# Patient Record
Sex: Male | Born: 2000 | Race: White | Hispanic: No | Marital: Single | Smoking: Never smoker
Health system: Southern US, Community
[De-identification: ages and names within clinical notes are randomized; demographics above are authoritative.]

## PROBLEM LIST (undated history)

## (undated) DIAGNOSIS — L709 Acne, unspecified: Secondary | ICD-10-CM

## (undated) HISTORY — DX: Acne, unspecified: L70.9

---
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
## 2018-03-12 ENCOUNTER — Ambulatory Visit
Admission: RE | Admit: 2018-03-12 | Discharge: 2018-03-12 | Disposition: A | Discharge status: Home / Self Care | Payer: 59 | Source: Ambulatory Visit | Attending: Family Medicine | Admitting: Family Medicine

## 2018-03-12 ENCOUNTER — Ambulatory Visit: Payer: 59 | Admitting: Family Medicine

## 2018-03-12 VITALS — BP 118/82 | Ht 74.0 in | Wt 174.0 lb

## 2018-03-12 DIAGNOSIS — G8929 Other chronic pain: Secondary | ICD-10-CM

## 2018-03-12 DIAGNOSIS — M542 Cervicalgia: Secondary | ICD-10-CM | POA: Diagnosis not present

## 2018-03-12 NOTE — Patient Instructions (Addendum)
Get x-rays of your cervical spine - we will call you with results. If these look good I would recommend physical therapy for the deep paraspinal posture muscles. Tylenol, aleve only if needed - it's unlikely these will help you get better quicker. Follow up with me 4 weeks after starting physical therapy assuming that's where we go next and your x-rays look good.

## 2018-03-13 ENCOUNTER — Encounter: Payer: Self-pay | Admitting: Family Medicine

## 2018-03-13 ENCOUNTER — Ambulatory Visit: Payer: 59 | Admitting: Sports Medicine

## 2018-03-13 NOTE — Assessment & Plan Note (Signed)
exam is reassuring.  No red flags.  No radicular symptoms.  Independently reviewed radiographs performed today and no evidence spondy, bony abnormalities, arthropathy.  Consistent with deep paraspinal muscle strain.  Start physical therapy and f/u in 4-6 weeks following PT.  Can consider MRI if not improving as expected but expect him to have good success with this.  Tylenol, aleve if needed.
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
## 2018-03-20 DIAGNOSIS — M542 Cervicalgia: Secondary | ICD-10-CM | POA: Diagnosis not present

## 2018-03-20 DIAGNOSIS — S161XXD Strain of muscle, fascia and tendon at neck level, subsequent encounter: Secondary | ICD-10-CM | POA: Diagnosis not present

## 2018-03-24 DIAGNOSIS — M542 Cervicalgia: Secondary | ICD-10-CM | POA: Diagnosis not present

## 2018-03-24 DIAGNOSIS — S161XXD Strain of muscle, fascia and tendon at neck level, subsequent encounter: Secondary | ICD-10-CM | POA: Diagnosis not present

## 2018-03-28 DIAGNOSIS — S161XXD Strain of muscle, fascia and tendon at neck level, subsequent encounter: Secondary | ICD-10-CM | POA: Diagnosis not present

## 2018-03-28 DIAGNOSIS — M542 Cervicalgia: Secondary | ICD-10-CM | POA: Diagnosis not present

## 2018-03-31 DIAGNOSIS — S161XXD Strain of muscle, fascia and tendon at neck level, subsequent encounter: Secondary | ICD-10-CM | POA: Diagnosis not present

## 2018-03-31 DIAGNOSIS — M542 Cervicalgia: Secondary | ICD-10-CM | POA: Diagnosis not present

## 2018-04-04 DIAGNOSIS — S161XXD Strain of muscle, fascia and tendon at neck level, subsequent encounter: Secondary | ICD-10-CM | POA: Diagnosis not present

## 2018-04-04 DIAGNOSIS — M542 Cervicalgia: Secondary | ICD-10-CM | POA: Diagnosis not present

## 2018-04-18 DIAGNOSIS — S161XXD Strain of muscle, fascia and tendon at neck level, subsequent encounter: Secondary | ICD-10-CM | POA: Diagnosis not present

## 2018-04-18 DIAGNOSIS — M542 Cervicalgia: Secondary | ICD-10-CM | POA: Diagnosis not present

## 2018-04-21 DIAGNOSIS — M542 Cervicalgia: Secondary | ICD-10-CM | POA: Diagnosis not present

## 2018-04-21 DIAGNOSIS — S161XXD Strain of muscle, fascia and tendon at neck level, subsequent encounter: Secondary | ICD-10-CM | POA: Diagnosis not present

## 2018-04-25 DIAGNOSIS — M542 Cervicalgia: Secondary | ICD-10-CM | POA: Diagnosis not present

## 2018-04-28 DIAGNOSIS — M542 Cervicalgia: Secondary | ICD-10-CM | POA: Diagnosis not present

## 2018-04-28 DIAGNOSIS — S161XXD Strain of muscle, fascia and tendon at neck level, subsequent encounter: Secondary | ICD-10-CM | POA: Diagnosis not present

## 2018-05-01 DIAGNOSIS — Z0000 Encounter for general adult medical examination without abnormal findings: Secondary | ICD-10-CM | POA: Diagnosis not present

## 2018-05-02 DIAGNOSIS — S161XXD Strain of muscle, fascia and tendon at neck level, subsequent encounter: Secondary | ICD-10-CM | POA: Diagnosis not present

## 2018-05-02 DIAGNOSIS — M542 Cervicalgia: Secondary | ICD-10-CM | POA: Diagnosis not present

## 2018-05-12 DIAGNOSIS — S161XXD Strain of muscle, fascia and tendon at neck level, subsequent encounter: Secondary | ICD-10-CM | POA: Diagnosis not present

## 2018-05-12 DIAGNOSIS — M542 Cervicalgia: Secondary | ICD-10-CM | POA: Diagnosis not present

## 2018-09-16 DIAGNOSIS — D225 Melanocytic nevi of trunk: Secondary | ICD-10-CM | POA: Diagnosis not present

## 2018-09-16 DIAGNOSIS — L814 Other melanin hyperpigmentation: Secondary | ICD-10-CM | POA: Diagnosis not present

## 2018-09-16 DIAGNOSIS — D485 Neoplasm of uncertain behavior of skin: Secondary | ICD-10-CM | POA: Diagnosis not present

## 2018-09-26 DIAGNOSIS — J019 Acute sinusitis, unspecified: Secondary | ICD-10-CM | POA: Diagnosis not present

## 2018-09-26 DIAGNOSIS — J4520 Mild intermittent asthma, uncomplicated: Secondary | ICD-10-CM | POA: Diagnosis not present

## 2018-10-06 DIAGNOSIS — J019 Acute sinusitis, unspecified: Secondary | ICD-10-CM | POA: Diagnosis not present

## 2019-10-18 IMAGING — DX DG CERVICAL SPINE COMPLETE 4+V
5 series · 5 of 5 positions shown · non-contrast
Comparison: None.

CLINICAL DATA: Neck pain for several months, no known injury,
initial encounter

EXAM:
CERVICAL SPINE - COMPLETE 4+ VIEW

[dg cervical spine complete (1 of 5)]
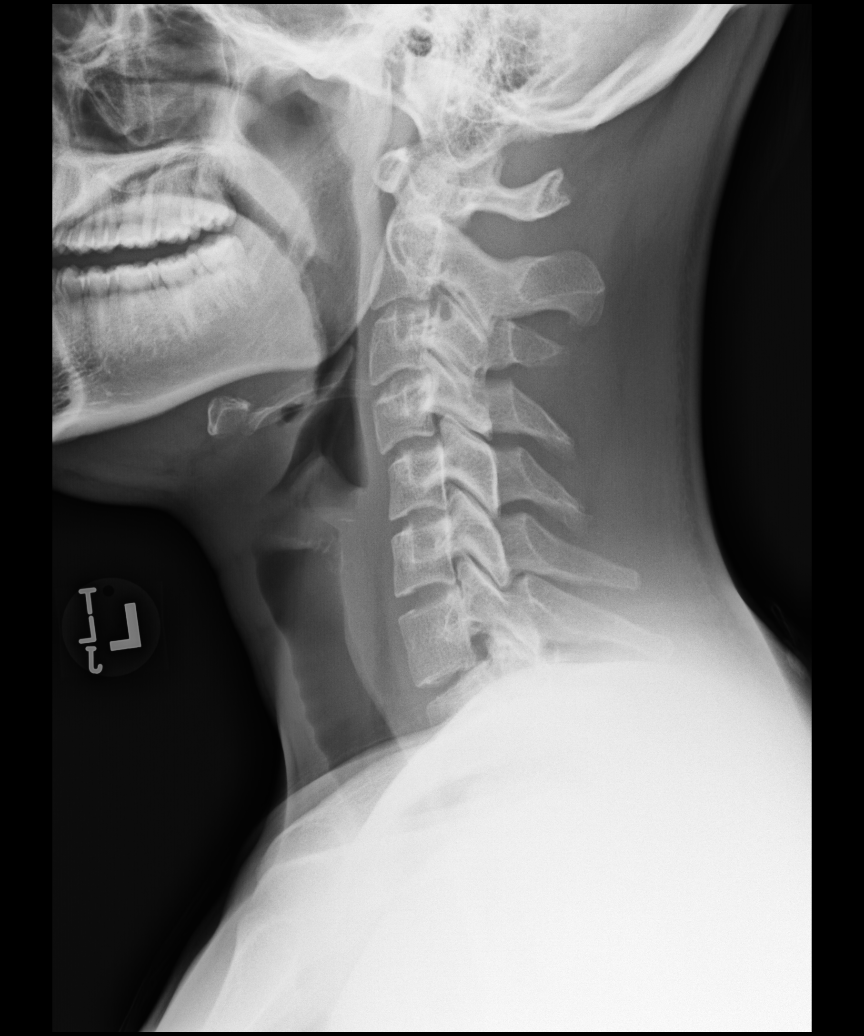

[dg cervical spine complete (2 of 5)]
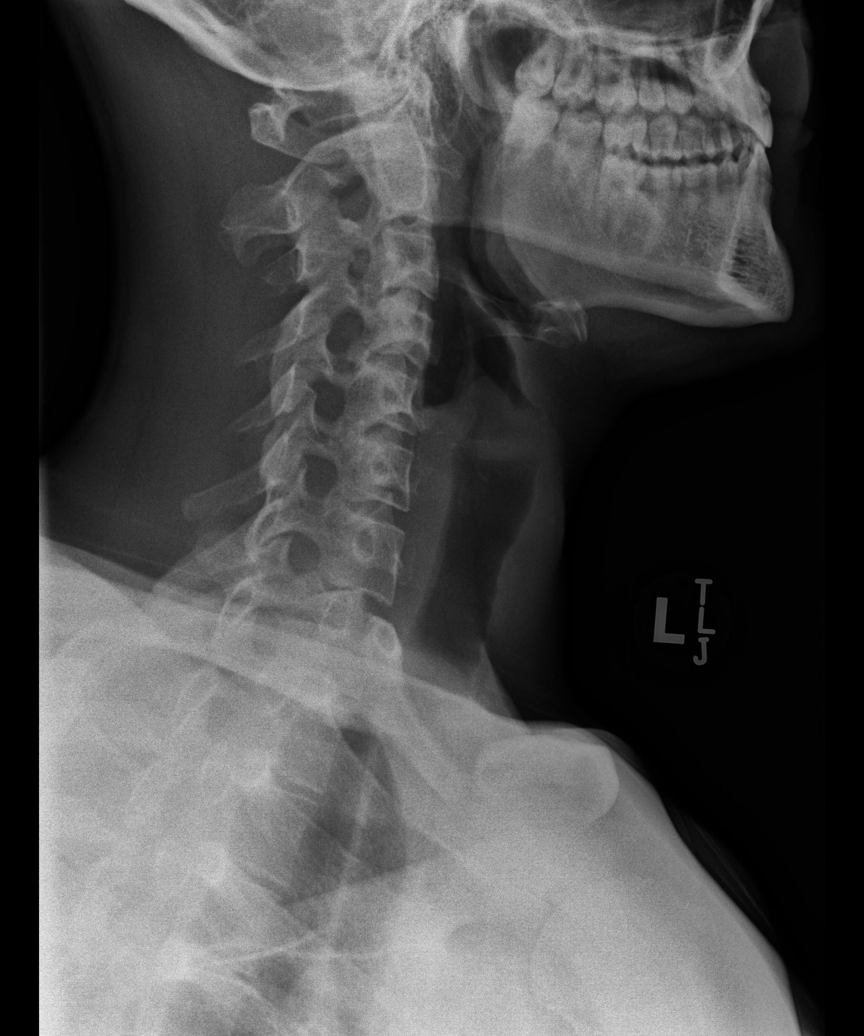

[dg cervical spine complete (3 of 5)]
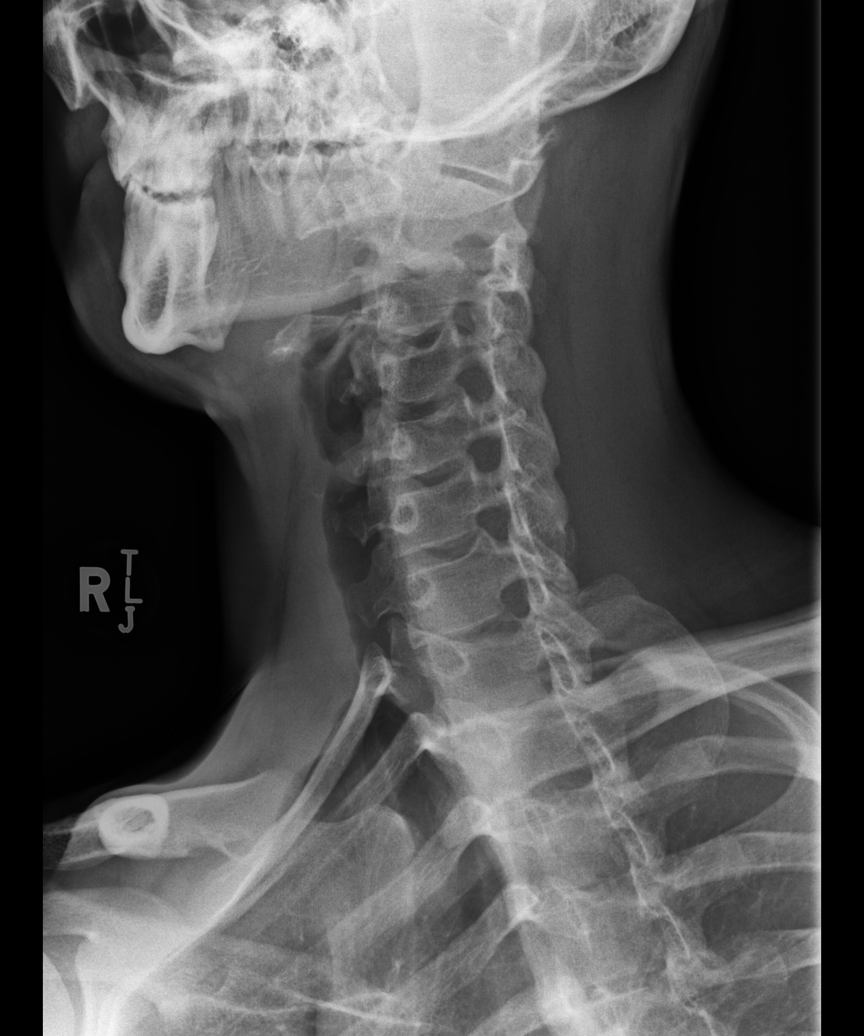

[dg cervical spine complete (4 of 5)]
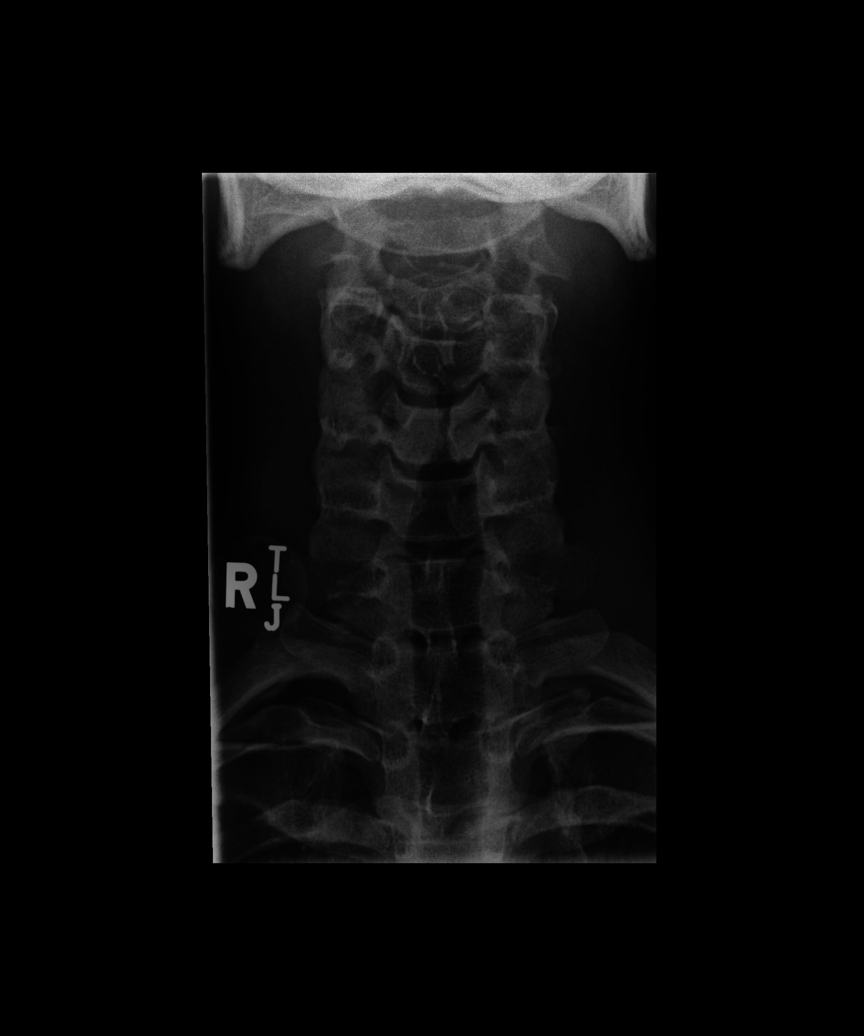

[dg cervical spine complete (5 of 5)]
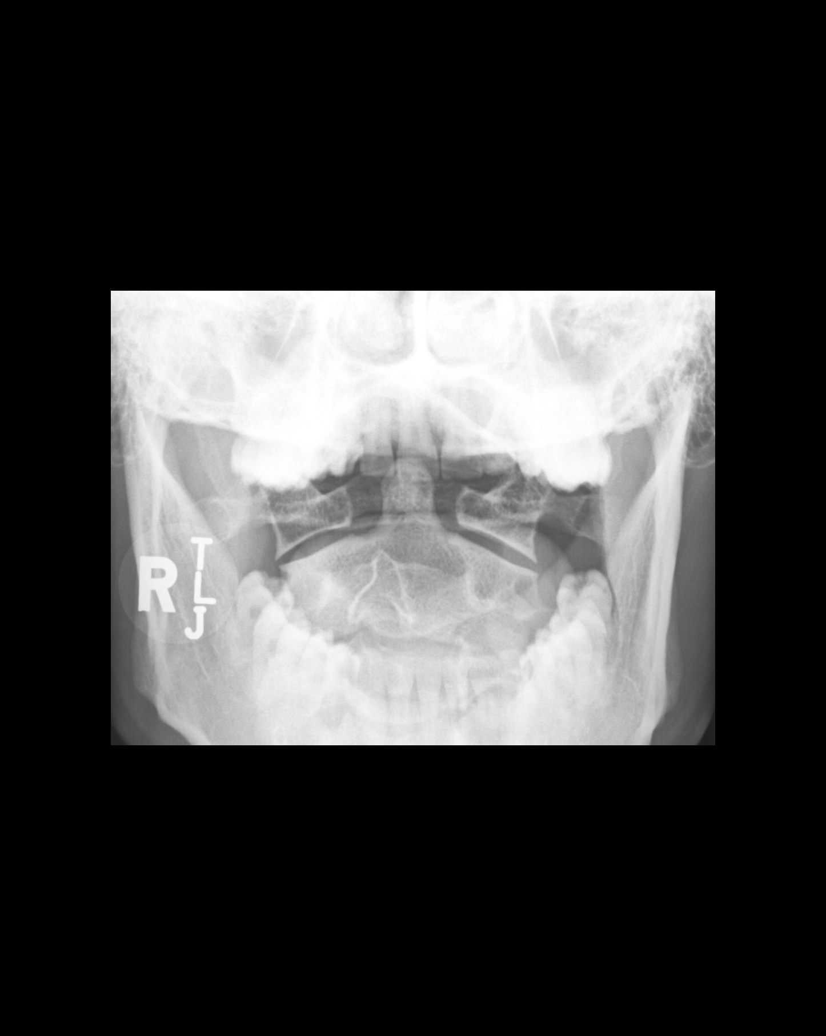

[5 of 5 positions shown; findings below may reference images not displayed]

FINDINGS: Seven cervical segments are well visualized. Vertebral body height
is well maintained. No soft tissue changes are seen. The neural
foramina are widely patent although the C3-4 neural foramina
bilaterally is somewhat small. This is likely congenital in nature.
No acute fracture is seen. No facet abnormality is noted. The
odontoid is within normal limits.
IMPRESSION: No acute abnormality noted. Some decrease in size of the C3-4 neural
foramina bilaterally is noted. This is likely congenital in nature.
The need for further evaluation can be determined on a clinical
basis.
